# Patient Record
Sex: Female | Born: 1995 | Race: White | Hispanic: No | Marital: Married | Smoking: Never smoker
Health system: Southern US, Community
[De-identification: ages and names within clinical notes are randomized; demographics above are authoritative.]

## PROBLEM LIST (undated history)

## (undated) DIAGNOSIS — G43909 Migraine, unspecified, not intractable, without status migrainosus: Secondary | ICD-10-CM

## (undated) DIAGNOSIS — F329 Major depressive disorder, single episode, unspecified: Secondary | ICD-10-CM

## (undated) DIAGNOSIS — F32A Depression, unspecified: Secondary | ICD-10-CM

## (undated) DIAGNOSIS — Z973 Presence of spectacles and contact lenses: Secondary | ICD-10-CM

## (undated) HISTORY — DX: Major depressive disorder, single episode, unspecified: F32.9

## (undated) HISTORY — DX: Presence of spectacles and contact lenses: Z97.3

## (undated) HISTORY — DX: Depression, unspecified: F32.A

## (undated) HISTORY — DX: Migraine, unspecified, not intractable, without status migrainosus: G43.909

---

## 2014-04-29 ENCOUNTER — Encounter (HOSPITAL_COMMUNITY): Payer: Self-pay | Admitting: Emergency Medicine

## 2014-04-29 ENCOUNTER — Emergency Department (HOSPITAL_COMMUNITY)
Admission: EM | Admit: 2014-04-29 | Discharge: 2014-04-30 | Disposition: A | Discharge status: Home / Self Care | Payer: BLUE CROSS/BLUE SHIELD | Attending: Emergency Medicine | Admitting: Emergency Medicine

## 2014-04-29 ENCOUNTER — Emergency Department (HOSPITAL_COMMUNITY): Payer: BLUE CROSS/BLUE SHIELD

## 2014-04-29 DIAGNOSIS — S300XXA Contusion of lower back and pelvis, initial encounter: Secondary | ICD-10-CM | POA: Insufficient documentation

## 2014-04-29 DIAGNOSIS — W109XXA Fall (on) (from) unspecified stairs and steps, initial encounter: Secondary | ICD-10-CM | POA: Diagnosis not present

## 2014-04-29 DIAGNOSIS — Y998 Other external cause status: Secondary | ICD-10-CM | POA: Diagnosis not present

## 2014-04-29 DIAGNOSIS — Y9389 Activity, other specified: Secondary | ICD-10-CM | POA: Diagnosis not present

## 2014-04-29 DIAGNOSIS — S3992XA Unspecified injury of lower back, initial encounter: Secondary | ICD-10-CM | POA: Diagnosis present

## 2014-04-29 DIAGNOSIS — Y92009 Unspecified place in unspecified non-institutional (private) residence as the place of occurrence of the external cause: Secondary | ICD-10-CM | POA: Diagnosis not present

## 2014-04-29 DIAGNOSIS — S50812A Abrasion of left forearm, initial encounter: Secondary | ICD-10-CM | POA: Diagnosis not present

## 2014-04-29 MED ORDER — CYCLOBENZAPRINE HCL 10 MG PO TABS: 10.0000 mg | ORAL_TABLET | Freq: Two times a day (BID) | ORAL | Status: DC | PRN

## 2014-04-29 MED ORDER — IBUPROFEN 800 MG PO TABS: 800.0000 mg | ORAL_TABLET | Freq: Three times a day (TID) | ORAL | Status: DC

## 2014-04-29 NOTE — Discharge Instructions (Signed)
Contusion A contusion is a deep bruise. Contusions are the result of an injury that caused bleeding under the skin. The contusion may turn blue, purple, or yellow. Minor injuries will give you a painless contusion, but more severe contusions may stay painful and swollen for a few weeks.  CAUSES  A contusion is usually caused by a blow, trauma, or direct force to an area of the body. SYMPTOMS   Swelling and redness of the injured area.  Bruising of the injured area.  Tenderness and soreness of the injured area.  Pain. DIAGNOSIS  The diagnosis can be made by taking a history and physical exam. An X-ray, CT scan, or MRI may be needed to determine if there were any associated injuries, such as fractures. TREATMENT  Specific treatment will depend on what area of the body was injured. In general, the best treatment for a contusion is resting, icing, elevating, and applying cold compresses to the injured area. Over-the-counter medicines may also be recommended for pain control. Ask your caregiver what the best treatment is for your contusion. HOME CARE INSTRUCTIONS   Put ice on the injured area.  Put ice in a plastic bag.  Place a towel between your skin and the bag.  Leave the ice on for 15-20 minutes, 3-4 times a day, or as directed by your health care provider.  Only take over-the-counter or prescription medicines for pain, discomfort, or fever as directed by your caregiver. Your caregiver may recommend avoiding anti-inflammatory medicines (aspirin, ibuprofen, and naproxen) for 48 hours because these medicines may increase bruising.  Rest the injured area.  If possible, elevate the injured area to reduce swelling. SEEK IMMEDIATE MEDICAL CARE IF:   You have increased bruising or swelling.  You have pain that is getting worse.  Your swelling or pain is not relieved with medicines. MAKE SURE YOU:   Understand these instructions.  Will watch your condition.  Will get help right  away if you are not doing well or get worse. Document Released: 12/19/2004 Document Revised: 03/16/2013 Document Reviewed: 01/14/2011 [facility] Patient Information 2015 [place], [place]. This information is not intended to replace advice given to you by your health care provider. Make sure you discuss any questions you have with your health care provider. Fall Prevention and Home Safety Falls cause injuries and can affect all age groups. It is possible to use preventive measures to significantly decrease the likelihood of falls. There are many simple measures which can make your home safer and prevent falls. OUTDOORS Repair cracks and edges of walkways and driveways. Remove high doorway thresholds. Trim shrubbery on the main path into your home. Have good outside lighting. Clear walkways of tools, rocks, debris, and clutter. Check that handrails are not broken and are securely fastened. Both sides of steps should have handrails. Have leaves, snow, and ice cleared regularly. Use sand or salt on walkways during winter months. In the garage, clean up grease or oil spills. BATHROOM Install night lights. Install grab bars by the toilet and in the tub and shower. Use non-skid mats or decals in the tub or shower. Place a plastic non-slip stool in the shower to sit on, if needed. Keep floors dry and clean up all water on the floor immediately. Remove soap buildup in the tub or shower on a regular basis. Secure bath mats with non-slip, double-sided rug tape. Remove throw rugs and tripping hazards from the floors. BEDROOMS Install night lights. Make sure a bedside light is easy to reach. Do  not use oversized bedding. Keep a telephone by your bedside. Have a firm chair with side arms to use for getting dressed. Remove throw rugs and tripping hazards from the floor. KITCHEN Keep handles on pots and pans turned toward the center of the stove. Use back burners when possible. Clean up spills quickly  and allow time for drying. Avoid walking on wet floors. Avoid hot utensils and knives. Position shelves so they are not too high or low. Place commonly used objects within easy reach. If necessary, use a sturdy step stool with a grab bar when reaching. Keep electrical cables out of the way. Do not use floor polish or wax that makes floors slippery. If you must use wax, use non-skid floor wax. Remove throw rugs and tripping hazards from the floor. STAIRWAYS Never leave objects on stairs. Place handrails on both sides of stairways and use them. Fix any loose handrails. Make sure handrails on both sides of the stairways are as long as the stairs. Check carpeting to make sure it is firmly attached along stairs. Make repairs to worn or loose carpet promptly. Avoid placing throw rugs at the top or bottom of stairways, or properly secure the rug with carpet tape to prevent slippage. Get rid of throw rugs, if possible. Have an electrician put in a light switch at the top and bottom of the stairs. OTHER FALL PREVENTION TIPS Wear low-heel or rubber-soled shoes that are supportive and fit well. Wear closed toe shoes. When using a stepladder, make sure it is fully opened and both spreaders are firmly locked. Do not climb a closed stepladder. Add color or contrast paint or tape to grab bars and handrails in your home. Place contrasting color strips on first and last steps. Learn and use mobility aids as needed. Install an electrical emergency response system. Turn on lights to avoid dark areas. Replace light bulbs that burn out immediately. Get light switches that glow. Arrange furniture to create clear pathways. Keep furniture in the same place. Firmly attach carpet with non-skid or double-sided tape. Eliminate uneven floor surfaces. Select a carpet pattern that does not visually hide the edge of steps. Be aware of all pets. OTHER HOME SAFETY TIPS Set the water temperature for 120 F (48.8 C). Keep  emergency numbers on or near the telephone. Keep smoke detectors on every level of the home and near sleeping areas. Document Released: 03/01/2002 Document Revised: 09/10/2011 Document Reviewed: 05/31/2011 [facility] Patient Information 2015 [place], [place]. This information is not intended to replace advice given to you by your health care provider. Make sure you discuss any questions you have with your health care provider. Abrasion An abrasion is a cut or scrape of the skin. Abrasions do not extend through all layers of the skin and most heal within 10 days. It is important to care for your abrasion properly to prevent infection. CAUSES  Most abrasions are caused by falling on, or gliding across, the ground or other surface. When your skin rubs on something, the outer and inner layer of skin rubs off, causing an abrasion. DIAGNOSIS  Your caregiver will be able to diagnose an abrasion during a physical exam.  TREATMENT  Your treatment depends on how large and deep the abrasion is. Generally, your abrasion will be cleaned with water and a mild soap to remove any dirt or debris. An antibiotic ointment may be put over the abrasion to prevent an infection. A bandage (dressing) may be wrapped around the abrasion to keep it from  getting dirty.  You may need a tetanus shot if:  You cannot remember when you had your last tetanus shot.  You have never had a tetanus shot.  The injury broke your skin. If you get a tetanus shot, your arm may swell, get red, and feel warm to the touch. This is common and not a problem. If you need a tetanus shot and you choose not to have one, there is a rare chance of getting tetanus. Sickness from tetanus can be serious.  HOME CARE INSTRUCTIONS   If a dressing was applied, change it at least once a day or as directed by your caregiver. If the bandage sticks, soak it off with warm water.   Wash the area with water and a mild soap to remove all the ointment 2 times a  day. Rinse off the soap and pat the area dry with a clean towel.   Reapply any ointment as directed by your caregiver. This will help prevent infection and keep the bandage from sticking. Use gauze over the wound and under the dressing to help keep the bandage from sticking.   Change your dressing right away if it becomes wet or dirty.   Only take over-the-counter or prescription medicines for pain, discomfort, or fever as directed by your caregiver.   Follow up with your caregiver within 24-48 hours for a wound check, or as directed. If you were not given a wound-check appointment, look closely at your abrasion for redness, swelling, or pus. These are signs of infection. SEEK IMMEDIATE MEDICAL CARE IF:   You have increasing pain in the wound.   You have redness, swelling, or tenderness around the wound.   You have pus coming from the wound.   You have a fever or persistent symptoms for more than 2-3 days.  You have a fever and your symptoms suddenly get worse.  You have a bad smell coming from the wound or dressing.  MAKE SURE YOU:   Understand these instructions.  Will watch your condition.  Will get help right away if you are not doing well or get worse. Document Released: 12/19/2004 Document Revised: 02/26/2012 Document Reviewed: 02/12/2011 [facility] Patient Information 2015 [place], [place]. This information is not intended to replace advice given to you by your health care provider. Make sure you discuss any questions you have with your health care provider.

## 2014-04-29 NOTE — ED Provider Notes (Signed)
CSN: [id number]     Arrival date & time 04/29/14  2209 History   None    Chief Complaint  Patient presents with  . Fall     (Consider location/radiation/quality/duration/timing/severity/associated sxs/prior Treatment) Patient is a 19 y.o. female presenting with fall. The history is provided by the patient. No language interpreter was used.  Fall This is a new problem. The current episode started today. The problem occurs constantly. The problem has been unchanged. Associated symptoms comments: Back pain and left forearm pain. Nothing aggravates the symptoms. She has tried nothing for the symptoms. The treatment provided no relief.  Pt reports she slipped on stairs and landed on low back.  Pt complains of pain to left forearm and low back  History reviewed. No pertinent past medical history. History reviewed. No pertinent past surgical history. No family history on file. History  Substance Use Topics  . Smoking status: Never Smoker   . Smokeless tobacco: Not on file  . Alcohol Use: No   OB History    No data available     Review of Systems  All other systems reviewed and are negative.     Allergies  Review of patient's allergies indicates no known allergies.  Home Medications   Prior to Admission medications   Not on File   BP 132/92 mmHg  Pulse 72  Temp(Src) 98 F (36.7 C) (Oral)  Resp 15  Ht 5\' 5"  (1.651 m)  Wt 190 lb (86.183 kg)  BMI 31.62 kg/m2  SpO2 98%  LMP 04/26/2014 Physical Exam  Constitutional: She is oriented to person, place, and time. She appears well-developed and well-nourished.  HENT:  Head: Normocephalic and atraumatic.  Cardiovascular: Normal rate.   Pulmonary/Chest: Effort normal.  Abdominal: Soft.  Musculoskeletal: She exhibits tenderness.  Abrasion left forearm, tender mid forearm, pain with movement,  Tender lower lumbar area,  Pain with sitting.  Neurological: She is alert and oriented to person, place, and time. She has normal  reflexes.  Skin: Skin is warm.  Psychiatric: She has a normal mood and affect.  Nursing note and vitals reviewed.   ED Course  Procedures (including critical care time) Labs Review Labs Reviewed - No data to display  Imaging Review Dg Lumbar Spine Complete  04/29/2014   CLINICAL DATA:  Lumbar spine pain after fall down stairs. Pain greater on the left.  EXAM: LUMBAR SPINE - COMPLETE 4+ VIEW  COMPARISON:  None.  FINDINGS: The alignment is maintained. Vertebral body heights are normal. There is no listhesis. The posterior elements are intact. Questionable L5 pars interarticularis defects. Disc spaces are preserved. No fracture. Sacroiliac joints are symmetric and normal.  IMPRESSION: No fracture or subluxation of the lumbar spine.   Electronically Signed   By: Rubye OaksMelanie  Ehinger M.D.   On: 04/29/2014 23:33   Dg Forearm Left  04/29/2014   CLINICAL DATA:  Larey SeatFell down stairs today.  Forearm pain.  EXAM: LEFT FOREARM - 2 VIEW  COMPARISON:  None.  FINDINGS: The wrist and elbow joints are maintained. No acute forearm fracture.  IMPRESSION: No acute bony findings.   Electronically Signed   By: Loralie ChampagneMark  Gallerani M.D.   On: 04/29/2014 23:33     EKG Interpretation None      MDM  No fracture.   Pt declined pain medication     Final diagnoses:  Abrasion of forearm, left, initial encounter  Contusion of lower back, initial encounter    Ibuprofen Flexeril Follow up with Dr.Olin if pain persist  past one week.    Lonia Skinner Marvin, PA-C 04/29/14 1610  Gilda Crease, MD 04/30/14 [phone]

## 2014-04-29 NOTE — ED Notes (Signed)
Pt. slipped on stairs at home and fell , no LOC / ambulatory , alert and oriented , respirations unlabored , reports pain at lower back and left forearm pain with abrasion .

## 2014-05-30 ENCOUNTER — Telehealth: Payer: Self-pay | Admitting: Oncology

## 2014-05-30 NOTE — Telephone Encounter (Signed)
PT CONFIRMED APPT FOR 06/28/14 AT 10:30 W/SHADAD DX: THROMBOCYTOSIS REFERRING DR. Hoover BrownsEMA KULWA

## 2014-05-31 ENCOUNTER — Telehealth: Payer: Self-pay | Admitting: Oncology

## 2014-05-31 NOTE — Telephone Encounter (Signed)
CHART DELIVERED ON 05/31/14.  TG °

## 2014-06-28 ENCOUNTER — Telehealth: Payer: Self-pay | Admitting: Oncology

## 2014-06-28 ENCOUNTER — Other Ambulatory Visit: Payer: BLUE CROSS/BLUE SHIELD

## 2014-06-28 ENCOUNTER — Ambulatory Visit: Payer: BLUE CROSS/BLUE SHIELD | Admitting: Oncology

## 2014-06-28 ENCOUNTER — Ambulatory Visit: Payer: BLUE CROSS/BLUE SHIELD

## 2014-06-28 NOTE — Telephone Encounter (Signed)
Pt aware of appt. On  08/03/14@10 :30

## 2014-07-06 ENCOUNTER — Emergency Department (HOSPITAL_COMMUNITY)
Admission: EM | Admit: 2014-07-06 | Discharge: 2014-07-07 | Disposition: A | Discharge status: Home / Self Care | Payer: Managed Care, Other (non HMO) | Attending: Emergency Medicine | Admitting: Emergency Medicine

## 2014-07-06 ENCOUNTER — Encounter (HOSPITAL_COMMUNITY): Payer: Self-pay | Admitting: *Deleted

## 2014-07-06 DIAGNOSIS — W228XXA Striking against or struck by other objects, initial encounter: Secondary | ICD-10-CM | POA: Diagnosis not present

## 2014-07-06 DIAGNOSIS — Z791 Long term (current) use of non-steroidal anti-inflammatories (NSAID): Secondary | ICD-10-CM | POA: Diagnosis not present

## 2014-07-06 DIAGNOSIS — S60222A Contusion of left hand, initial encounter: Secondary | ICD-10-CM | POA: Diagnosis not present

## 2014-07-06 DIAGNOSIS — Y998 Other external cause status: Secondary | ICD-10-CM | POA: Diagnosis not present

## 2014-07-06 DIAGNOSIS — S6992XA Unspecified injury of left wrist, hand and finger(s), initial encounter: Secondary | ICD-10-CM | POA: Diagnosis present

## 2014-07-06 DIAGNOSIS — Y929 Unspecified place or not applicable: Secondary | ICD-10-CM | POA: Diagnosis not present

## 2014-07-06 DIAGNOSIS — Y9389 Activity, other specified: Secondary | ICD-10-CM | POA: Diagnosis not present

## 2014-07-06 NOTE — ED Provider Notes (Signed)
CSN: [id number]     Arrival date & time 07/06/14  2315 History  This chart was scribed for non-physician practitioner, Jaynie Crumbleatyana Andren Bethea, PA-C, working with Cathren LaineKevin Steinl, MD, by Lionel DecemberHatice Demirci, ED Scribe. This patient was seen in room TR10C/TR10C and the patient's care was started at 11:49 PM.   First MD Initiated Contact with Patient 07/06/14 2348     Chief Complaint  Patient presents with  . Hand Pain     (Consider location/radiation/quality/duration/timing/severity/associated sxs/prior Treatment) HPI  HPI Comments: Theresa Norman ClayLaceria is a 19 y.o. female who presents to the Emergency Department complaining of right hand pain after slamming her hand on a door.  Patient has associated symptoms of swelling and states she is only able to move her thumb.  Patient has no other complaints today.     History reviewed. No pertinent past medical history. History reviewed. No pertinent past surgical history. No family history on file. History  Substance Use Topics  . Smoking status: Never Smoker   . Smokeless tobacco: Never Used  . Alcohol Use: No   OB History    No data available     Review of Systems  Constitutional: Negative for fever and chills.  HENT: Negative for sneezing and sore throat.   Eyes: Negative for redness and itching.  Musculoskeletal: Positive for myalgias and joint swelling.      Allergies  Review of patient's allergies indicates no known allergies.  Home Medications   Prior to Admission medications   Medication Sig Start Date End Date Taking? Authorizing Provider  cyclobenzaprine (FLEXERIL) 10 MG tablet Take 1 tablet (10 mg total) by mouth 2 (two) times daily as needed for muscle spasms. 04/29/14   Elson AreasLeslie K Sofia, PA-C  ibuprofen (ADVIL,MOTRIN) 800 MG tablet Take 1 tablet (800 mg total) by mouth 3 (three) times daily. 04/29/14   Elson AreasLeslie K Sofia, PA-C   BP 137/86 mmHg  Pulse 92  Temp(Src) 98.5 F (36.9 C)  Resp 18  Ht 5\' 5"  (1.651 m)  Wt 228 lb (103.42 kg)   BMI 37.94 kg/m2  SpO2 97%  LMP 05/07/2014 Physical Exam  Constitutional: She appears well-developed and well-nourished. No distress.  Eyes: Conjunctivae are normal.  Neck: Neck supple.  Musculoskeletal:  Swelling noted over right hand specifically over dorsal surface of the fourth and fifth metacarpals. Tender to palpation. She also has tenderness over ulnar aspect of the right wrist joint. Pain with any range of motion of the wrist joint. Pain with any range of motion at MCP joints of any fingers. Cap refill less than 2 seconds distally.  Neurological: She is alert.  Skin: Skin is warm and dry.  Nursing note and vitals reviewed.   ED Course  Procedures (including critical care time) DIAGNOSTIC STUDIES: Oxygen Saturation is 97% on RA, normal by my interpretation.    COORDINATION OF CARE: 11:51 PM Discussed treatment plan with patient at beside, the patient agrees with the plan and has no further questions at this time.   Labs Review Labs Reviewed - No data to display  Imaging Review Dg Wrist Complete Right  07/07/2014   CLINICAL DATA:  Right hand and wrist pain, hit hand on a door earlier tonight.  EXAM: RIGHT WRIST - COMPLETE 3+ VIEW  COMPARISON:  None.  FINDINGS: No fracture or dislocation. The joint spaces are maintained. Incidental note of mild ulna minus variance. The scaphoid is intact. No focal soft tissue abnormality.  IMPRESSION: No fracture or dislocation of the right wrist.   Electronically  Signed   By: Rubye Oaks M.D.   On: 07/07/2014 02:41   Dg Hand Complete Right  07/07/2014   CLINICAL DATA:  Right hand pain, hit hand on a door earlier tonight. Swelling of metacarpal phalangeal joints.  EXAM: RIGHT HAND - COMPLETE 3+ VIEW  COMPARISON:  None.  FINDINGS: No fracture or dislocation. The alignment and joint spaces are maintained. Mild soft tissue edema, no radiopaque foreign body.  IMPRESSION: Soft tissue edema, no fracture or dislocation.   Electronically Signed   By:  Rubye Oaks M.D.   On: 07/07/2014 02:40     EKG Interpretation None      MDM   Final diagnoses:  Hand contusion, left, initial encounter    Patient is here with right hand injury after hitting it on a door out of anger. She is having wrist and hand pain. X-ray reading was delayed by radiology due to number of x-rays needed to be red. X-rays are back negative. Patient placed in a wrist splint. Home with NSAIDs, elevation, ice. Follow-up as needed.  Filed Vitals:   07/06/14 2333 07/07/14 0247  BP: 137/86 130/82  Pulse: 92 79  Temp: 98.5 F (36.9 C) 97.6 F (36.4 C)  TempSrc:  Oral  Resp: 18 16  Height:  (1.651 m)   Weight: 228 lb (103.42 kg)   SpO2: 97% 98%     I personally performed the services described in this documentation, which was scribed in my presence. The recorded information has been reviewed and is accurate.    Jaynie Crumble, PA-C 07/07/14 1826  Arby Barrette, MD 07/08/14 [phone]

## 2014-07-06 NOTE — ED Notes (Signed)
Patient presents with right hand swelling after hitting a door with her forearm

## 2014-07-07 ENCOUNTER — Emergency Department (HOSPITAL_COMMUNITY): Payer: Managed Care, Other (non HMO)

## 2014-07-07 MED ORDER — NAPROXEN 500 MG PO TABS: 500.0000 mg | ORAL_TABLET | Freq: Two times a day (BID) | ORAL | Status: DC

## 2014-07-07 NOTE — Discharge Instructions (Signed)
Keep hand elevated. Ice. Naprosyn for pain and inflammation. Follow up with your doctor if not improving.   Hand Contusion A hand contusion is a deep bruise on your hand area. Contusions are the result of an injury that caused bleeding under the skin. The contusion may turn blue, purple, or yellow. Minor injuries will give you a painless contusion, but more severe contusions may stay painful and swollen for a few weeks. CAUSES  A contusion is usually caused by a blow, trauma, or direct force to an area of the body. SYMPTOMS   Swelling and redness of the injured area.  Discoloration of the injured area.  Tenderness and soreness of the injured area.  Pain. DIAGNOSIS  The diagnosis can be made by taking a history and performing a physical exam. An X-ray, CT scan, or MRI may be needed to determine if there were any associated injuries, such as broken bones (fractures). TREATMENT  Often, the best treatment for a hand contusion is resting, elevating, icing, and applying cold compresses to the injured area. Over-the-counter medicines may also be recommended for pain control. HOME CARE INSTRUCTIONS   Put ice on the injured area.  Put ice in a plastic bag.  Place a towel between your skin and the bag.  Leave the ice on for 15-20 minutes, 03-04 times a day.  Only take over-the-counter or prescription medicines as directed by your caregiver. Your caregiver may recommend avoiding anti-inflammatory medicines (aspirin, ibuprofen, and naproxen) for 48 hours because these medicines may increase bruising.  If told, use an elastic wrap as directed. This can help reduce swelling. You may remove the wrap for sleeping, showering, and bathing. If your fingers become numb, cold, or blue, take the wrap off and reapply it more loosely.  Elevate your hand with pillows to reduce swelling.  Avoid overusing your hand if it is painful. SEEK IMMEDIATE MEDICAL CARE IF:   You have increased redness, swelling,  or pain in your hand.  Your swelling or pain is not relieved with medicines.  You have loss of feeling in your hand or are unable to move your fingers.  Your hand turns cold or blue.  You have pain when you move your fingers.  Your hand becomes warm to the touch.  Your contusion does not improve in 2 days. MAKE SURE YOU:   Understand these instructions.  Will watch your condition.  Will get help right away if you are not doing well or get worse. Document Released: 08/31/2001 Document Revised: 12/04/2011 Document Reviewed: 09/02/2011 [facility] Patient Information 2015 [place], [place]. This information is not intended to replace advice given to you by your health care provider. Make sure you discuss any questions you have with your health care provider.

## 2014-08-03 ENCOUNTER — Ambulatory Visit: Payer: BLUE CROSS/BLUE SHIELD | Admitting: Oncology

## 2014-08-03 ENCOUNTER — Other Ambulatory Visit: Payer: BLUE CROSS/BLUE SHIELD

## 2014-08-03 ENCOUNTER — Ambulatory Visit: Payer: BLUE CROSS/BLUE SHIELD

## 2014-11-11 ENCOUNTER — Encounter (HOSPITAL_COMMUNITY): Payer: Self-pay | Admitting: Emergency Medicine

## 2014-11-11 ENCOUNTER — Emergency Department (HOSPITAL_COMMUNITY)
Admission: EM | Admit: 2014-11-11 | Discharge: 2014-11-11 | Disposition: A | Discharge status: Home / Self Care | Payer: BLUE CROSS/BLUE SHIELD | Attending: Emergency Medicine | Admitting: Emergency Medicine

## 2014-11-11 DIAGNOSIS — M62838 Other muscle spasm: Secondary | ICD-10-CM | POA: Diagnosis not present

## 2014-11-11 DIAGNOSIS — Z791 Long term (current) use of non-steroidal anti-inflammatories (NSAID): Secondary | ICD-10-CM | POA: Insufficient documentation

## 2014-11-11 DIAGNOSIS — M542 Cervicalgia: Secondary | ICD-10-CM | POA: Diagnosis present

## 2014-11-11 MED ORDER — IBUPROFEN 600 MG PO TABS: 600.0000 mg | ORAL_TABLET | Freq: Three times a day (TID) | ORAL | Status: DC | PRN

## 2014-11-11 MED ORDER — CYCLOBENZAPRINE HCL 10 MG PO TABS: 10.0000 mg | ORAL_TABLET | Freq: Three times a day (TID) | ORAL | Status: DC | PRN

## 2014-11-11 MED ORDER — KETOROLAC TROMETHAMINE 60 MG/2ML IM SOLN
60.0000 mg | Freq: Once | INTRAMUSCULAR | Status: AC
  Administered 2014-11-11: 60 mg via INTRAMUSCULAR
  Filled 2014-11-11: qty 2

## 2014-11-11 MED ORDER — OXYCODONE-ACETAMINOPHEN 5-325 MG PO TABS
1.0000 | ORAL_TABLET | Freq: Once | ORAL | Status: AC
  Administered 2014-11-11: 1 via ORAL
  Filled 2014-11-11: qty 1

## 2014-11-11 MED ORDER — DIAZEPAM 5 MG PO TABS
5.0000 mg | ORAL_TABLET | Freq: Once | ORAL | Status: AC
  Administered 2014-11-11: 5 mg via ORAL
  Filled 2014-11-11: qty 1

## 2014-11-11 NOTE — ED Provider Notes (Signed)
CSN: [id number]     Arrival date & time 11/11/14  0509 History   First MD Initiated Contact with Patient 11/11/14 0518     Chief Complaint  Patient presents with  . Neck Pain  . Shoulder Pain      HPI Patient reports she awoke with severe pain in her right neck with radiation down towards her right shoulder.  She states it is painful for her to turn her head to the right.  No fevers or chills.  Denies nausea vomiting.  Denies recent injury or trauma.  No other complaints.  Her pain is mild to moderate in severity.   History reviewed. No pertinent past medical history. History reviewed. No pertinent past surgical history. History reviewed. No pertinent family history. Social History  Substance Use Topics  . Smoking status: Never Smoker   . Smokeless tobacco: Never Used  . Alcohol Use: No   OB History    No data available     Review of Systems  All other systems reviewed and are negative.     Allergies  Review of patient's allergies indicates no known allergies.  Home Medications   Prior to Admission medications   Medication Sig Start Date End Date Taking? Authorizing Provider  cyclobenzaprine (FLEXERIL) 10 MG tablet Take 1 tablet (10 mg total) by mouth 3 (three) times daily as needed for muscle spasms. 11/11/14   Azalia Bilis, MD  ibuprofen (ADVIL,MOTRIN) 600 MG tablet Take 1 tablet (600 mg total) by mouth every 8 (eight) hours as needed. 11/11/14   Azalia Bilis, MD  naproxen (NAPROSYN) 500 MG tablet Take 1 tablet (500 mg total) by mouth 2 (two) times daily. 07/07/14   Tatyana Kirichenko, PA-C   BP 121/83 mmHg  Pulse 81  Temp(Src) 97.8 F (36.6 C) (Oral)  Resp 18  SpO2 100%  LMP 10/28/2014 Physical Exam  Constitutional: She is oriented to person, place, and time. She appears well-developed and well-nourished. No distress.  HENT:  Head: Normocephalic and atraumatic.  Eyes: EOM are normal.  Neck: Normal range of motion.  Mild spasm and tenderness of the left  paracervical muscles.  No rash.  Normal strength in her bilateral upper extremity's.  Normal range of motion of her right shoulder.  Cardiovascular: Normal rate, regular rhythm and normal heart sounds.   Pulmonary/Chest: Effort normal and breath sounds normal.  Abdominal: Soft. She exhibits no distension. There is no tenderness.  Musculoskeletal: Normal range of motion.  Neurological: She is alert and oriented to person, place, and time.  Skin: Skin is warm and dry.  Psychiatric: She has a normal mood and affect. Judgment normal.  Nursing note and vitals reviewed.   ED Course  Procedures (including critical care time) Labs Review Labs Reviewed - No data to display  Imaging Review No results found. I have personally reviewed and evaluated these images and lab results as part of my medical decision-making.   EKG Interpretation None      MDM   Final diagnoses:  Cervical paraspinous muscle spasm    6:14 AM Patient is beginning to feel improvement at this time.  Home with anti-inflammatories, muscle relaxants, heat pack.    Azalia Bilis, MD 11/11/14 [phone]

## 2014-11-11 NOTE — ED Notes (Signed)
Pt verbalized understanding of d/c instructions and no further questions. Pt stable and NAD.  

## 2014-11-11 NOTE — ED Notes (Signed)
Heating packs applied to neck

## 2014-11-11 NOTE — ED Notes (Addendum)
Pt reports neck and RIGHT shoulder pain that started in the middle of the night which woke her up; pt denies known injury to shoulder; pt has limited ROM d/t pain; CMS intact distally

## 2014-12-27 ENCOUNTER — Emergency Department (HOSPITAL_COMMUNITY): Payer: BLUE CROSS/BLUE SHIELD

## 2014-12-27 ENCOUNTER — Emergency Department (HOSPITAL_COMMUNITY)
Admission: EM | Admit: 2014-12-27 | Discharge: 2014-12-27 | Disposition: A | Discharge status: Home / Self Care | Payer: BLUE CROSS/BLUE SHIELD | Attending: Emergency Medicine | Admitting: Emergency Medicine

## 2014-12-27 ENCOUNTER — Encounter (HOSPITAL_COMMUNITY): Payer: Self-pay | Admitting: *Deleted

## 2014-12-27 DIAGNOSIS — R42 Dizziness and giddiness: Secondary | ICD-10-CM | POA: Diagnosis not present

## 2014-12-27 DIAGNOSIS — M25512 Pain in left shoulder: Secondary | ICD-10-CM

## 2014-12-27 DIAGNOSIS — R200 Anesthesia of skin: Secondary | ICD-10-CM | POA: Diagnosis not present

## 2014-12-27 DIAGNOSIS — Z3202 Encounter for pregnancy test, result negative: Secondary | ICD-10-CM | POA: Diagnosis not present

## 2014-12-27 DIAGNOSIS — R202 Paresthesia of skin: Secondary | ICD-10-CM | POA: Insufficient documentation

## 2014-12-27 DIAGNOSIS — N390 Urinary tract infection, site not specified: Secondary | ICD-10-CM | POA: Insufficient documentation

## 2014-12-27 DIAGNOSIS — Z79899 Other long term (current) drug therapy: Secondary | ICD-10-CM | POA: Diagnosis not present

## 2014-12-27 LAB — URINALYSIS, ROUTINE W REFLEX MICROSCOPIC
BILIRUBIN URINE: NEGATIVE
Glucose, UA: NEGATIVE mg/dL
Hgb urine dipstick: NEGATIVE
Ketones, ur: NEGATIVE mg/dL
Nitrite: NEGATIVE
PH: 6 (ref 5.0–8.0)
Protein, ur: NEGATIVE mg/dL
SPECIFIC GRAVITY, URINE: 1.019 (ref 1.005–1.030)
UROBILINOGEN UA: 1 mg/dL (ref 0.0–1.0)

## 2014-12-27 LAB — URINE MICROSCOPIC-ADD ON

## 2014-12-27 LAB — POC URINE PREG, ED: Preg Test, Ur: NEGATIVE

## 2014-12-27 MED ORDER — CEPHALEXIN 500 MG PO CAPS: 500.0000 mg | ORAL_CAPSULE | Freq: Two times a day (BID) | ORAL | Status: DC

## 2014-12-27 MED ORDER — NAPROXEN 500 MG PO TABS: 500.0000 mg | ORAL_TABLET | Freq: Two times a day (BID) | ORAL | Status: DC

## 2014-12-27 NOTE — Discharge Instructions (Signed)
Please read and follow all provided instructions.  Your diagnoses today include:  1. Shoulder pain, left   2. UTI (lower urinary tract infection)   3. Episodic lightheadedness     Tests performed today include:  An x-ray of the affected area - does NOT show any broken bones  Vital signs. See below for your results today.   Medications prescribed:   Naproxen - anti-inflammatory pain medication  Do not exceed  naproxen every 12 hours, take with food  You have been prescribed an anti-inflammatory medication or NSAID. Take with food. Take smallest effective dose for the shortest duration needed for your pain. Stop taking if you experience stomach pain or vomiting.    Keflex (cephalexin) - antibiotic  You have been prescribed an antibiotic medicine: take the entire course of medicine even if you are feeling better. Stopping early can cause the antibiotic not to work.  Take any prescribed medications only as directed.  Home care instructions:   Follow any educational materials contained in this packet  Follow R.I.C.E. Protocol:  R - rest your injury   I  - use ice on injury without applying directly to skin  C - compress injury with bandage or splint  E - elevate the injury as much as possible  Follow-up instructions: Please follow-up with your primary care provider or the provided orthopedic physician (bone specialist) if you continue to have significant pain in 1 week. In this case you may have a more severe injury that requires further care.   Return instructions:   Please return if your fingers are numb or tingling, appear gray or blue, or you have severe pain (also elevate the arm and loosen splint or wrap if you were given one)  Please return to the Emergency Department if you experience worsening symptoms.   Please return if you have any other emergent concerns.  Additional Information:  Your vital signs today were: BP 132/112 mmHg   Pulse 68   Temp(Src)  98 F (36.7 C) (Oral)   Resp 20   SpO2 100%   LMP 12/13/2014 (Approximate) If your blood pressure (BP) was elevated above 135/85 this visit, please have this repeated by your doctor within one month. --------------

## 2014-12-27 NOTE — ED Provider Notes (Signed)
CSN: [id number]     Arrival date & time 12/27/14  1131 History   First MD Initiated Contact with Patient 12/27/14 1153     Chief Complaint  Patient presents with  . Dizziness  . Shoulder Pain     (Consider location/radiation/quality/duration/timing/severity/associated sxs/prior Treatment) HPI Comments: Patient presents with chief complaint of tingling extending from her left shoulder into her left arm. There is no complete numbness. Tingling is completely around her forearm and upper arm. This started approximately 3 days ago. She denies injury, but does do repetitive motions at work. She has not had any weakness or trouble using her hand, grasping objects. No color change of her extremity. No treatments prior to arrival. Nothing makes symptoms better or worse. She does not have a history of cervical radiculopathy. She has no weakness, numbness, or tingling in her lower extremities. She has no red flags of lower back pathology. No fevers, chills, weight loss.  Patient also complains of short-lived, intermittent episodes of dizziness, described as severe spinning. No syncope or near syncope. This has been going on for a long time. She has never taken any treatments for this. This is a secondary complaint today. Patient denies signs of stroke including: facial droop, slurred speech, aphasia, weakness in extremities, imbalance/trouble walking.   The history is provided by the patient.    History reviewed. No pertinent past medical history. History reviewed. No pertinent past surgical history. History reviewed. No pertinent family history. Social History  Substance Use Topics  . Smoking status: Never Smoker   . Smokeless tobacco: Never Used  . Alcohol Use: No   OB History    No data available     Review of Systems  Constitutional: Negative for fever, activity change and unexpected weight change.  Gastrointestinal: Negative for constipation.       Negative for fecal incontinence.    Genitourinary: Negative for dysuria, hematuria, flank pain, vaginal bleeding, vaginal discharge and pelvic pain.       Negative for urinary incontinence or retention.  Musculoskeletal: Positive for arthralgias. Negative for back pain, joint swelling and neck pain.  Skin: Negative for wound.  Neurological: Positive for dizziness and numbness (paresthesias). Negative for weakness and headaches.       Denies saddle paresthesias.      Allergies  Review of patient's allergies indicates no known allergies.  Home Medications   Prior to Admission medications   Medication Sig Start Date End Date Taking? Authorizing Provider  Biotin 1 MG CAPS Take 1 mg by mouth daily.   Yes Historical Provider, MD  Cholecalciferol (VITAMIN D3) 5000 UNITS TABS Take 5,000 Units by mouth daily.   Yes Historical Provider, MD  cyclobenzaprine (FLEXERIL) 10 MG tablet Take 1 tablet (10 mg total) by mouth 3 (three) times daily as needed for muscle spasms. Patient not taking: Reported on 12/27/2014 11/11/14   Azalia Bilis, MD  ibuprofen (ADVIL,MOTRIN) 600 MG tablet Take 1 tablet (600 mg total) by mouth every 8 (eight) hours as needed. Patient not taking: Reported on 12/27/2014 11/11/14   Azalia Bilis, MD  naproxen (NAPROSYN) 500 MG tablet Take 1 tablet (500 mg total) by mouth 2 (two) times daily. Patient not taking: Reported on 12/27/2014 07/07/14   Tatyana Kirichenko, PA-C   BP 132/112 mmHg  Pulse 68  Temp(Src) 98 F (36.7 C) (Oral)  Resp 20  SpO2 100%  LMP 12/13/2014 (Approximate) Physical Exam  Constitutional: She appears well-developed and well-nourished.  HENT:  Head: Normocephalic and atraumatic.  Eyes: Pupils  are equal, round, and reactive to light.  No nystagmus.  Neck: Normal range of motion. Neck supple.  Cardiovascular: Normal rate and regular rhythm.  Exam reveals no decreased pulses.   Pulses:      Radial pulses are 2+ on the right side, and 2+ on the left side.  Pulmonary/Chest: Effort normal. No  respiratory distress.  Musculoskeletal: She exhibits tenderness. She exhibits no edema.  Neurological: She is alert. She has normal strength. No cranial nerve deficit. She exhibits normal muscle tone. Coordination normal. GCS eye subscore is 4. GCS verbal subscore is 5. GCS motor subscore is 6.  Motor and vascular distal to the shoulder is fully intact. Does report decreased sensation but not loss of sensation, entire arm. No dermatomal distribution.   Skin: Skin is warm and dry.  Psychiatric: She has a normal mood and affect.  Nursing note and vitals reviewed.   ED Course  Procedures (including critical care time) Labs Review Labs Reviewed  URINALYSIS, ROUTINE W REFLEX MICROSCOPIC (NOT AT [facility]) - Abnormal; Notable for the following:    APPearance CLOUDY (*)    Leukocytes, UA LARGE (*)    All other components within normal limits  URINE MICROSCOPIC-ADD ON - Abnormal; Notable for the following:    Squamous Epithelial / LPF MANY (*)    Bacteria, UA MANY (*)    All other components within normal limits  POC URINE PREG, ED    Imaging Review Dg Shoulder Left  12/27/2014   CLINICAL DATA:  Left shoulder pain for 3 days.  No injury.  EXAM: LEFT SHOULDER - 2+ VIEW  COMPARISON:  None.  FINDINGS: There is no evidence of fracture or dislocation. There is no evidence of arthropathy or other focal bone abnormality. Soft tissues are unremarkable.  IMPRESSION: Negative.   Electronically Signed   By: Sherian Rein M.D.   On: 12/27/2014 14:14   I have personally reviewed and evaluated these images and lab results as part of my medical decision-making.   EKG Interpretation   Date/Time:  Tuesday December 27 2014 12:14:38 EDT Ventricular Rate:  74 PR Interval:  176 QRS Duration: 79 QT Interval:  376 QTC Calculation: 417 R Axis:   65 Text Interpretation:  Age not entered, assumed to be  19 years old for  purpose of ECG interpretation Sinus rhythm ST elev, probable normal early  repol pattern ED  PHYSICIAN INTERPRETATION AVAILABLE IN [facility]  Confirmed by TEST, Record ([id number]) on 12/28/2014 7:10:21 AM       3:09 PM Patient seen and examined. Work-up initiated.   Vital signs reviewed and are as follows: BP 132/112 mmHg  Pulse 68  Temp(Src) 98 F (36.7 C) (Oral)  Resp 20  SpO2 100%  LMP 12/13/2014 (Approximate)  X-ray negative. Patient informed. Encouraged orthopedic follow-up and referral given. Counseled on rice protocol. Informed of positive UA suggestive of UTI. Antibiotics prescribed.  Patient encouraged to return with numbness, weakness, color change, worsening pain or new symptoms. She verbalizes understanding and agrees with plan. Sling provided.  MDM   Final diagnoses:  Shoulder pain, left  UTI (lower urinary tract infection)  Episodic lightheadedness   Shoulder pain: Patient's symptoms are likely due to overuse injury from repetitive motions at work. She does not have any weakness or complete numbness. Her symptoms are in a non-dermatomal pattern. X-rays negative. She does not have other neurological symptoms to suggest MS or CVA. Tx naproxen, RICE.   UTI: Found incidentally on workup. Treated. No signs of  pyelonephritis.  Episodic lightheadedness: Sounds vertiginous in nature. Patient is not currently having this. It has been an ongoing problem for her. No concerning neurological deficits. This is not a constant symptom which would suggest CVA. PCP follow-up indicated.  Renne Crigler, PA-C 12/30/14 1546  Laurence Spates, MD 12/30/14 [phone]

## 2014-12-27 NOTE — ED Notes (Signed)
Pt walked to restroom and back to room. 

## 2014-12-27 NOTE — ED Notes (Signed)
Patient states dizziness that started approximately 1 week ago.   Patient states "I almost passed out yesterday".  Patient also complains of shoulder and arm pain.   Patient denies weakness.   Denies other symptoms.

## 2014-12-27 NOTE — ED Notes (Signed)
Patient reports left shoulder pain radiating down left arm, with numbness and tingling. Patient reports pain is constant x 3 days, denies any specific injury. ROM intact.

## 2015-06-02 ENCOUNTER — Ambulatory Visit (INDEPENDENT_AMBULATORY_CARE_PROVIDER_SITE_OTHER): Payer: Managed Care, Other (non HMO) | Admitting: Medical

## 2015-06-02 ENCOUNTER — Encounter: Payer: Self-pay | Admitting: Medical

## 2015-06-02 DIAGNOSIS — G44319 Acute post-traumatic headache, not intractable: Secondary | ICD-10-CM

## 2015-06-02 DIAGNOSIS — R42 Dizziness and giddiness: Secondary | ICD-10-CM

## 2015-06-02 DIAGNOSIS — S060X1D Concussion with loss of consciousness of 30 minutes or less, subsequent encounter: Secondary | ICD-10-CM | POA: Diagnosis not present

## 2015-06-02 DIAGNOSIS — S0101XD Laceration without foreign body of scalp, subsequent encounter: Secondary | ICD-10-CM
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
## 2015-06-09 ENCOUNTER — Encounter: Payer: Self-pay | Admitting: Medical

## 2015-06-09 ENCOUNTER — Ambulatory Visit (INDEPENDENT_AMBULATORY_CARE_PROVIDER_SITE_OTHER): Payer: Managed Care, Other (non HMO) | Admitting: Medical

## 2015-06-09 VITALS — BP 130/102 | HR 108 | Wt 231.0 lb

## 2015-06-09 DIAGNOSIS — E669 Obesity, unspecified: Secondary | ICD-10-CM | POA: Diagnosis not present

## 2015-06-09 DIAGNOSIS — IMO0001 Reserved for inherently not codable concepts without codable children: Secondary | ICD-10-CM

## 2015-06-09 DIAGNOSIS — M609 Myositis, unspecified: Secondary | ICD-10-CM | POA: Diagnosis not present

## 2015-06-09 DIAGNOSIS — M791 Myalgia: Secondary | ICD-10-CM | POA: Diagnosis not present

## 2015-06-09 DIAGNOSIS — R030 Elevated blood-pressure reading, without diagnosis of hypertension: Secondary | ICD-10-CM

## 2015-06-09 DIAGNOSIS — R42 Dizziness and giddiness: Secondary | ICD-10-CM

## 2015-06-09 DIAGNOSIS — G44309 Post-traumatic headache, unspecified, not intractable: Secondary | ICD-10-CM | POA: Diagnosis not present

## 2015-06-09 DIAGNOSIS — S060X1D Concussion with loss of consciousness of 30 minutes or less, subsequent encounter: Secondary | ICD-10-CM

## 2015-06-09 MED ORDER — CYCLOBENZAPRINE HCL 10 MG PO TABS: ORAL_TABLET | ORAL | Status: AC
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
## 2015-07-11 ENCOUNTER — Encounter: Payer: Self-pay | Admitting: Medical

## 2015-07-14 ENCOUNTER — Encounter: Payer: Self-pay | Admitting: Medical

## 2015-07-14 ENCOUNTER — Telehealth: Payer: Self-pay | Admitting: Medical

## 2015-07-14 ENCOUNTER — Ambulatory Visit (INDEPENDENT_AMBULATORY_CARE_PROVIDER_SITE_OTHER): Payer: Managed Care, Other (non HMO) | Admitting: Medical

## 2015-07-14 VITALS — BP 120/88 | HR 76 | Wt 231.0 lb

## 2015-07-14 DIAGNOSIS — R51 Headache: Secondary | ICD-10-CM | POA: Diagnosis not present

## 2015-07-14 DIAGNOSIS — R519 Headache, unspecified: Secondary | ICD-10-CM

## 2015-07-14 DIAGNOSIS — R413 Other amnesia: Secondary | ICD-10-CM | POA: Diagnosis not present

## 2015-07-14 DIAGNOSIS — F411 Generalized anxiety disorder: Secondary | ICD-10-CM | POA: Diagnosis not present
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
## 2015-07-14 NOTE — Addendum Note (Signed)
Addended by: Kieth BrightlyLAWSON, Leshia Kope M on: 07/14/2015 03:33 PM   Modules accepted: Orders

## 2015-07-14 NOTE — Telephone Encounter (Signed)
done

## 2015-07-31 ENCOUNTER — Ambulatory Visit: Payer: Managed Care, Other (non HMO) | Admitting: Neurology

## 2015-07-31 ENCOUNTER — Telehealth: Payer: Self-pay | Admitting: *Deleted

## 2015-07-31 ENCOUNTER — Encounter: Payer: Self-pay | Admitting: Neurology

## 2015-07-31 NOTE — Telephone Encounter (Signed)
no showed f/u 

## 2016-11-24 IMAGING — DX DG SHOULDER 2+V*L*
3 series · 3 of 3 positions shown · non-contrast
Comparison: None.

CLINICAL DATA: Left shoulder pain for 3 days.  No injury.

EXAM:
LEFT SHOULDER - 2+ VIEW

[shoulder grashey]
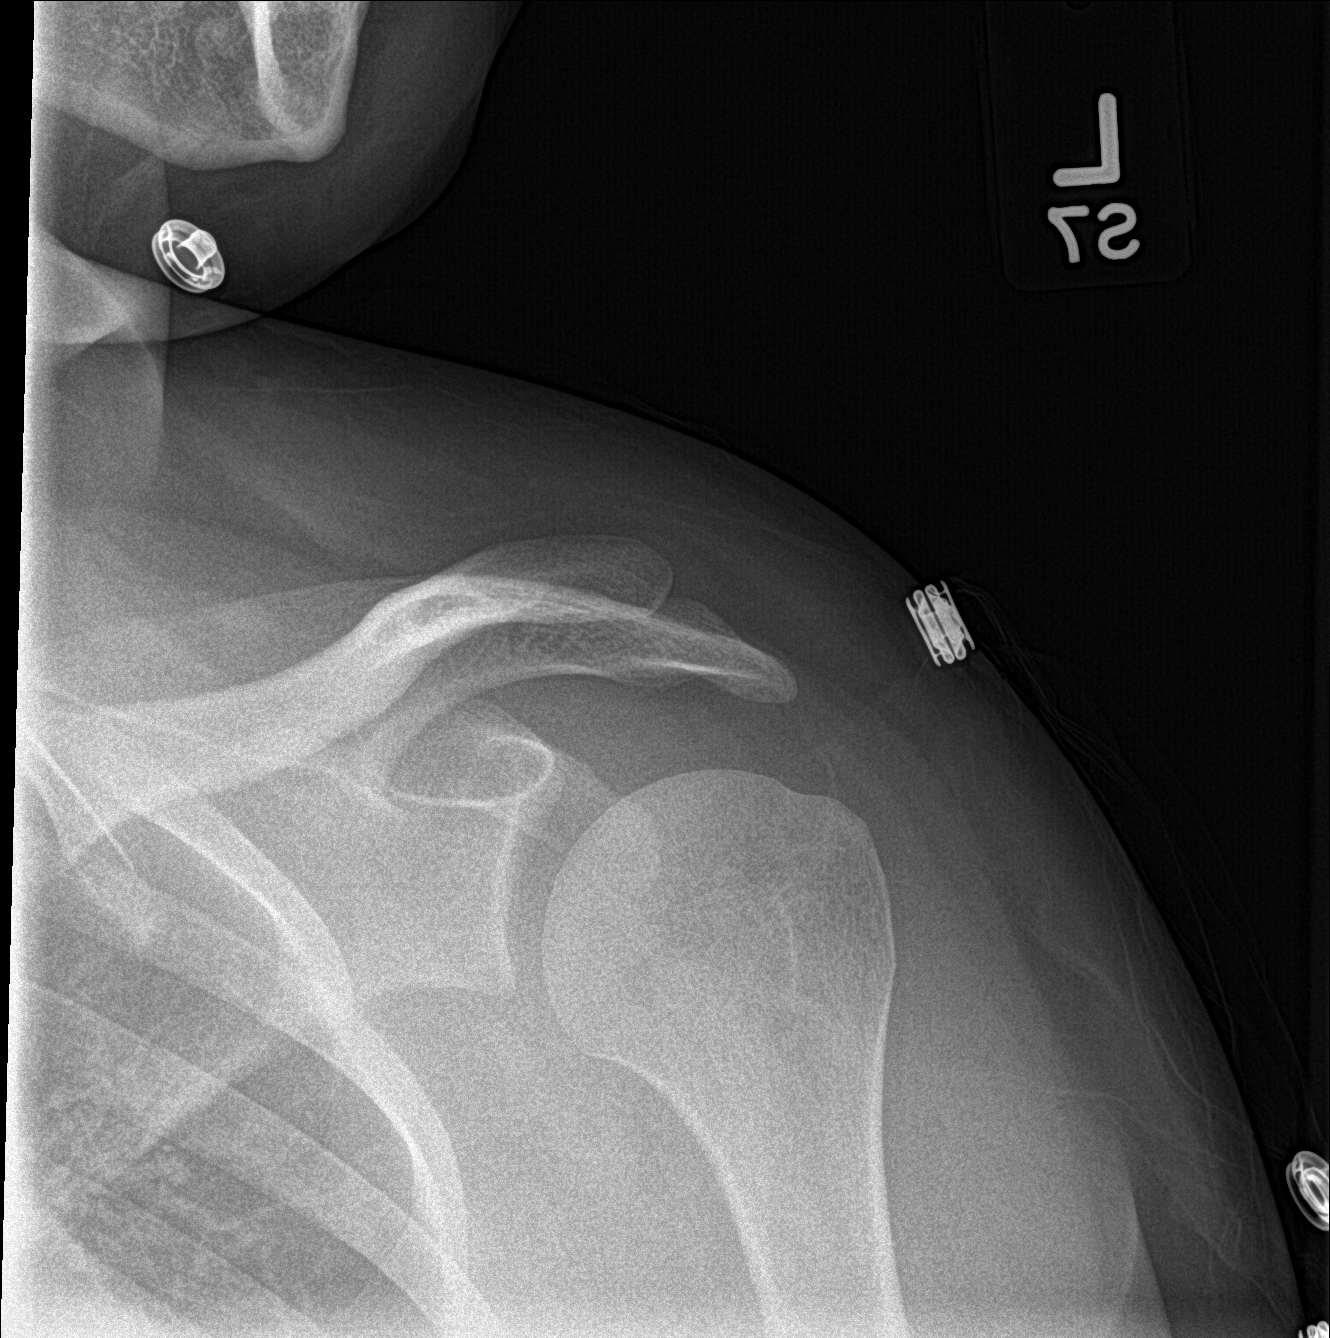

[shoulder y view]
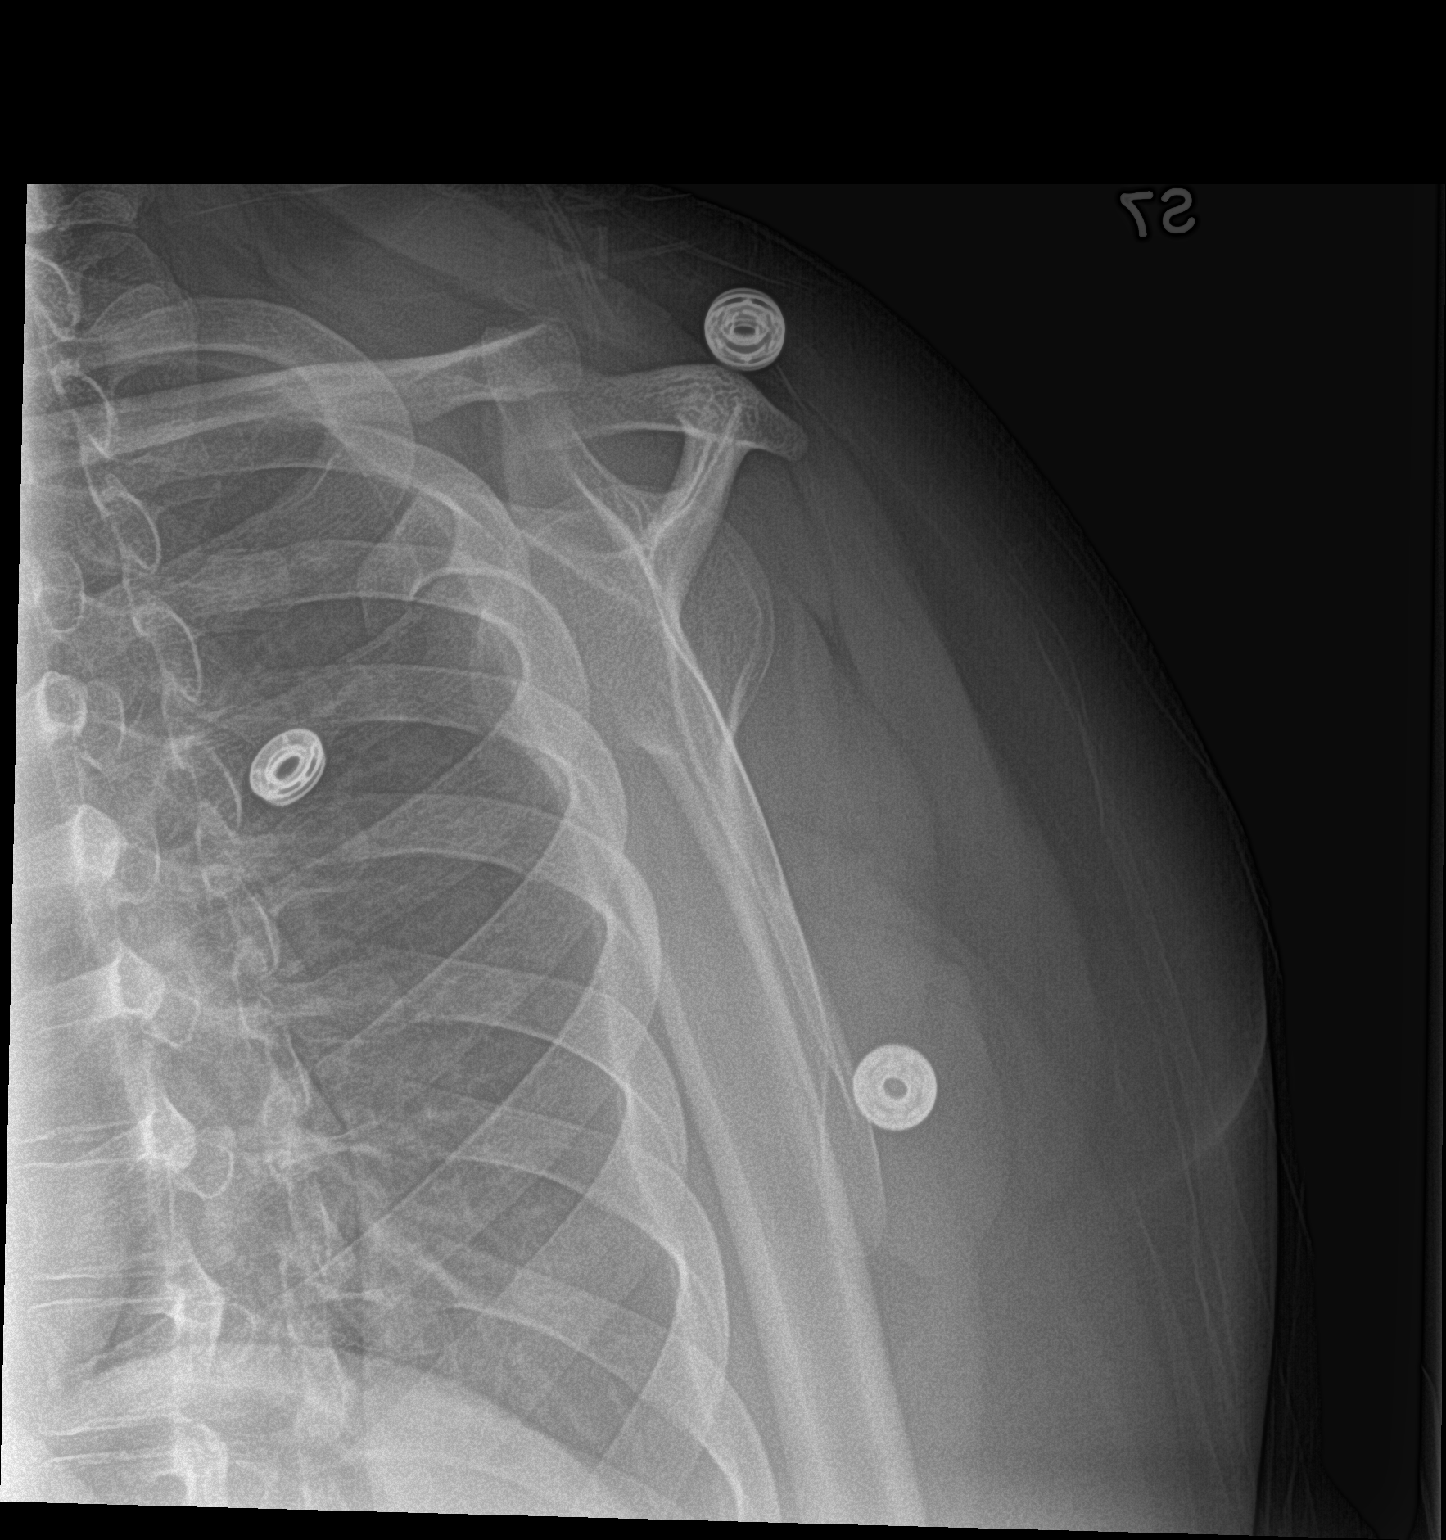

[shoulder axillary]
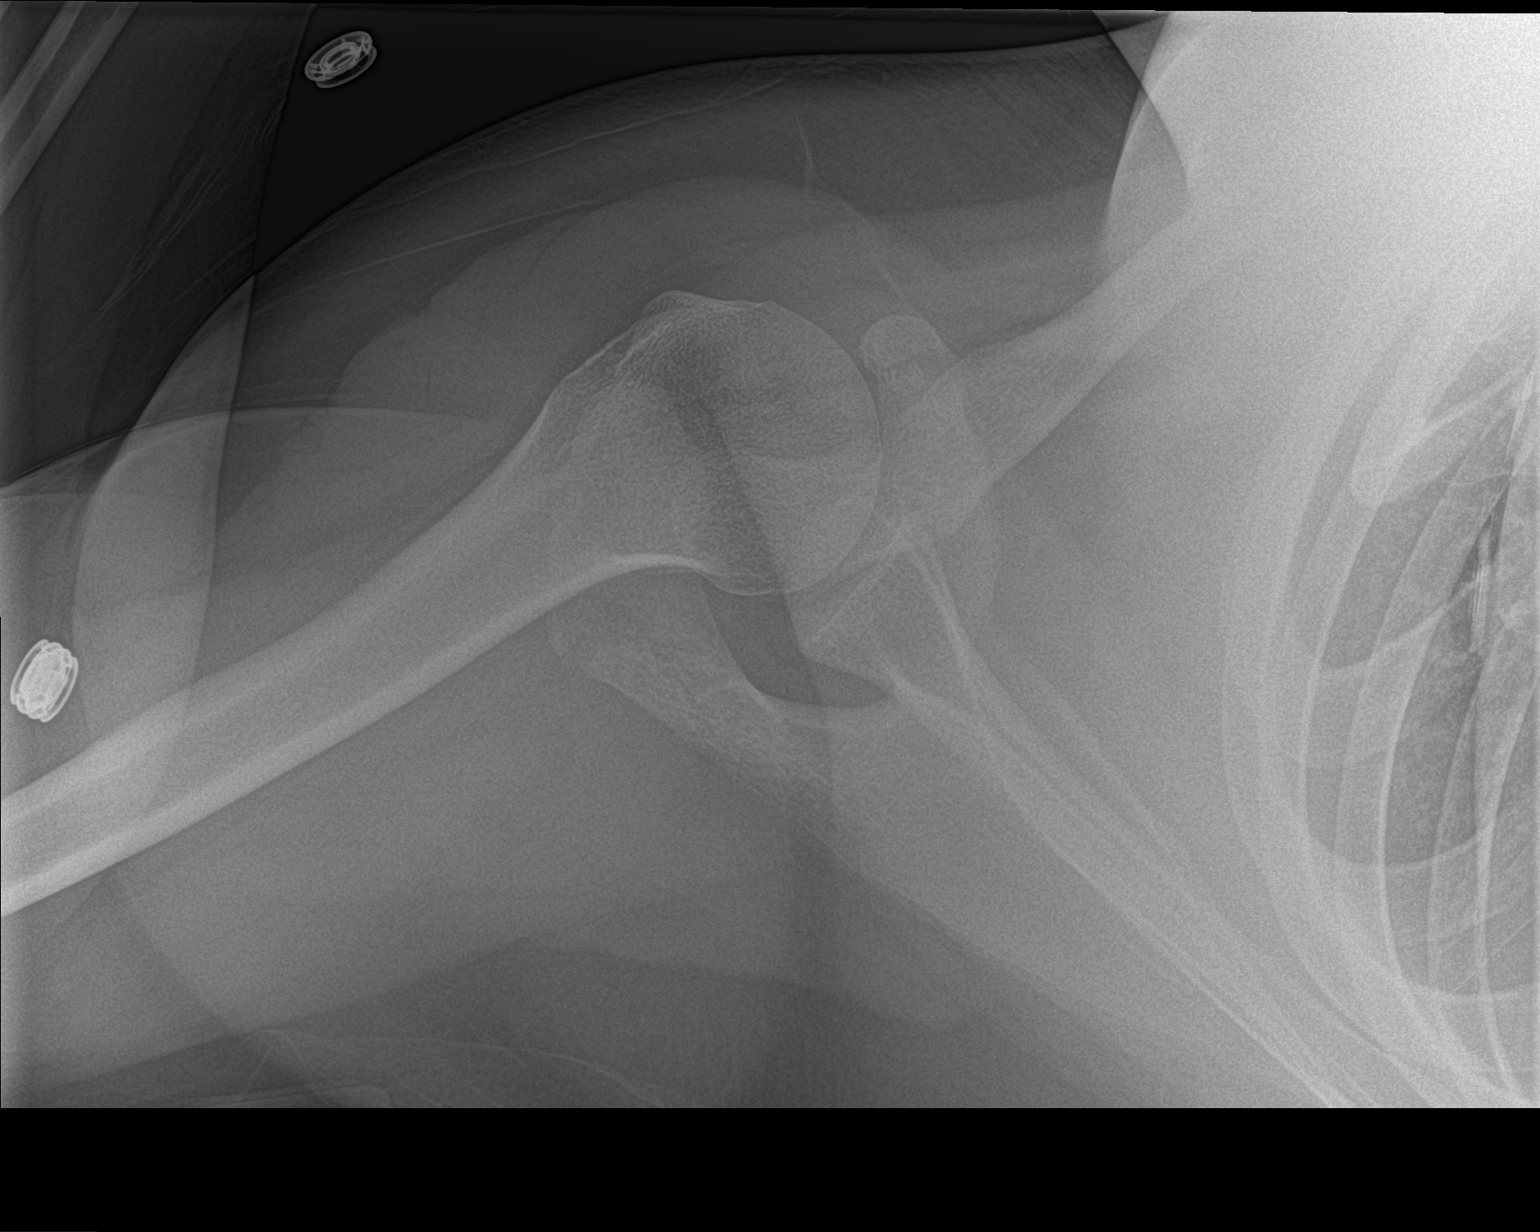

[3 of 3 positions shown; findings below may reference images not displayed]

FINDINGS: There is no evidence of fracture or dislocation. There is no
evidence of arthropathy or other focal bone abnormality. Soft
tissues are unremarkable.
IMPRESSION: Negative.
